# Patient Record
Sex: Male | Born: 1990 | Race: White | Hispanic: No | Marital: Single | Smoking: Never smoker
Health system: Southern US, Community
[De-identification: ages and names within clinical notes are randomized; demographics above are authoritative.]

## PROBLEM LIST (undated history)

## (undated) DIAGNOSIS — J329 Chronic sinusitis, unspecified: Secondary | ICD-10-CM

## (undated) DIAGNOSIS — T7840XA Allergy, unspecified, initial encounter: Secondary | ICD-10-CM

## (undated) HISTORY — PX: ADENOIDECTOMY: SUR15

## (undated) HISTORY — DX: Chronic sinusitis, unspecified: J32.9

## (undated) HISTORY — PX: OTHER SURGICAL HISTORY: SHX169

## (undated) HISTORY — PX: TYMPANOTOMY: SHX2588

## (undated) HISTORY — DX: Allergy, unspecified, initial encounter: T78.40XA

---
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
## 2008-01-12 ENCOUNTER — Ambulatory Visit: Payer: Self-pay | Admitting: Family Medicine

## 2008-01-12 DIAGNOSIS — J309 Allergic rhinitis, unspecified: Secondary | ICD-10-CM | POA: Insufficient documentation

## 2008-01-25 ENCOUNTER — Encounter: Payer: Self-pay | Admitting: Family Medicine

## 2008-04-16 ENCOUNTER — Telehealth: Payer: Self-pay | Admitting: Family Medicine

## 2008-10-18 ENCOUNTER — Ambulatory Visit: Payer: Self-pay | Admitting: Family Medicine

## 2008-10-18 DIAGNOSIS — J019 Acute sinusitis, unspecified: Secondary | ICD-10-CM

## 2008-12-15 ENCOUNTER — Ambulatory Visit: Payer: Self-pay | Admitting: Family Medicine

## 2008-12-15 DIAGNOSIS — H6690 Otitis media, unspecified, unspecified ear: Secondary | ICD-10-CM | POA: Insufficient documentation

## 2009-08-12 ENCOUNTER — Telehealth (INDEPENDENT_AMBULATORY_CARE_PROVIDER_SITE_OTHER): Payer: Self-pay | Admitting: *Deleted

## 2009-09-02 ENCOUNTER — Telehealth: Payer: Self-pay | Admitting: Family Medicine

## 2009-09-02 DIAGNOSIS — L708 Other acne: Secondary | ICD-10-CM | POA: Insufficient documentation

## 2010-02-25 NOTE — Progress Notes (Signed)
    Tetanus/Td Immunization History:    Tetanus/Td # 1:  Historical (07/22/2007)  Meningococcal Immunization History:    Meningococcal # 1:  Menactra (07/22/2007)

## 2010-02-25 NOTE — Progress Notes (Signed)
Summary: Derm referral  Phone Note Call from Patient Call back at Home Phone [phone]   Caller: Mom Call For: Nani Gasser MD Summary of Call: mom calls and wants to know if they need a referral to see derm for his acne Initial call taken by: Kathlene November,  September 02, 2009 12:01 PM  Follow-up for Phone Call        Will refer. tThat is fine.  Follow-up by: Nani Gasser MD,  September 02, 2009 12:03 PM  Additional Follow-up for Phone Call Additional follow up Details #1::        informed pt mom usually did not ned a referral. 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

## 2010-04-06 ENCOUNTER — Emergency Department (HOSPITAL_BASED_OUTPATIENT_CLINIC_OR_DEPARTMENT_OTHER)
Admission: EM | Admit: 2010-04-06 | Discharge: 2010-04-06 | Disposition: A | Discharge status: Home / Self Care | Payer: BC Managed Care – PPO | Attending: Emergency Medicine | Admitting: Emergency Medicine

## 2010-04-06 DIAGNOSIS — Y9366 Activity, soccer: Secondary | ICD-10-CM | POA: Insufficient documentation

## 2010-04-06 DIAGNOSIS — S060X0A Concussion without loss of consciousness, initial encounter: Secondary | ICD-10-CM | POA: Insufficient documentation

## 2010-04-06 DIAGNOSIS — W219XXA Striking against or struck by unspecified sports equipment, initial encounter: Secondary | ICD-10-CM | POA: Insufficient documentation

## 2010-05-22 ENCOUNTER — Ambulatory Visit: Payer: BC Managed Care – PPO | Admitting: Family Medicine

## 2010-05-23 ENCOUNTER — Other Ambulatory Visit: Payer: Self-pay | Admitting: *Deleted

## 2010-05-23 MED ORDER — MOMETASONE FUROATE 50 MCG/ACT NA SUSP: NASAL | Status: DC

## 2010-05-23 NOTE — Telephone Encounter (Signed)
Message left by [facility] stating pharmacy faxed request yesterday for Nasonex and has not received any response from this office. Refill given.  Message left on home number that rx has been sent, but any additional refills will not be given without an office visit.

## 2010-08-22 ENCOUNTER — Encounter: Payer: Self-pay | Admitting: Family Medicine

## 2010-08-22 ENCOUNTER — Inpatient Hospital Stay (INDEPENDENT_AMBULATORY_CARE_PROVIDER_SITE_OTHER)
Admission: RE | Admit: 2010-08-22 | Discharge: 2010-08-22 | Disposition: A | Discharge status: Home / Self Care | Payer: BC Managed Care – PPO | Source: Ambulatory Visit | Attending: Family Medicine | Admitting: Family Medicine

## 2010-08-22 DIAGNOSIS — L255 Unspecified contact dermatitis due to plants, except food: Secondary | ICD-10-CM

## 2010-08-23 ENCOUNTER — Telehealth (INDEPENDENT_AMBULATORY_CARE_PROVIDER_SITE_OTHER): Payer: Self-pay

## 2010-08-30 ENCOUNTER — Encounter: Payer: Self-pay | Admitting: Family Medicine

## 2010-09-01 ENCOUNTER — Ambulatory Visit (INDEPENDENT_AMBULATORY_CARE_PROVIDER_SITE_OTHER): Payer: BC Managed Care – PPO | Admitting: Family Medicine

## 2010-09-01 ENCOUNTER — Encounter: Payer: Self-pay | Admitting: Family Medicine

## 2010-09-01 DIAGNOSIS — J309 Allergic rhinitis, unspecified: Secondary | ICD-10-CM

## 2010-09-01 MED ORDER — MOMETASONE FUROATE 50 MCG/ACT NA SUSP: NASAL | Status: DC

## 2010-09-01 NOTE — Progress Notes (Signed)
  Subjective:    Patient ID: Louis Coffey, male    DOB: Feb 21, 1990, 20 y.o.   MRN: [id number]  HPI  Louis Coffey well with allergies.  Using OTC allegra.  Occ using  His nasonex.  Allergies are well controlled. Getting over a cold right now. Has had a cold for about 6 days. Already starting to fell better. Needs refill on meds.  No other complaints  Elevated BP - Not taking any NSAID or decongestants. Feeling well. No cold meds. No SP or SOB.   History updated.   Review of Systems BP 144/85  Pulse 83  Ht 6' (1.829 m)  Wt 202 lb (91.627 kg)  BMI 27.40 kg/m2    Allergies  Allergen Reactions  . Biaxin   . Clarithromycin     REACTION: Hives    Past Medical History  Diagnosis Date  . Allergy   . Recurrent sinus infections     Past Surgical History  Procedure Date  . Tympanotomy     tubes  . Adenoidectomy    . Rt thumb surgery     History   Social History  . Marital Status: Single    Spouse Name: N/A    Number of Children: N/A  . Years of Education: N/A   Occupational History  . Not on file.   Social History Main Topics  . Smoking status: Never Smoker   . Smokeless tobacco: Not on file  . Alcohol Use: Yes  . Drug Use: No  . Sexually Active: Yes    Birth Control/ Protection: Condom   Other Topics Concern  . Not on file   Social History Narrative  . No narrative on file    Family History  Problem Relation Age of Onset  . Hyperlipidemia Mother   . Hypothyroidism Mother   . Hypertension Father   . Asthma Father   . Sleep apnea Father   . Ovarian cancer Maternal Grandmother   . Diabetes Paternal Grandfather   . Allergies Sister     [name] had no medications administered during this visit.     Objective:   Physical Exam  Constitutional: He is oriented to person, place, and time. He appears well-developed and well-nourished.  HENT:  Head: Normocephalic and atraumatic.  Right Ear: External ear normal.  Left Ear: External ear normal.  Nose: Nose  normal.  Mouth/Throat: Oropharynx is clear and moist.       TMs and canals are clear.   Eyes: Conjunctivae and EOM are normal. Pupils are equal, round, and reactive to light.  Neck: Neck supple. No thyromegaly present.  Cardiovascular: Normal rate and normal heart sounds.   Pulmonary/Chest: Effort normal and breath sounds normal.  Lymphadenopathy:    He has no cervical adenopathy.  Neurological: He is alert and oriented to person, place, and time.  Skin: Skin is warm and dry.  Psychiatric: He has a normal mood and affect.          Assessment & Plan:  Elevated BP - Recheck today was just under goal so recheck in one year.

## 2010-09-01 NOTE — Assessment & Plan Note (Signed)
Doing well.  Refill nasonex. F/u in 1 yr.

## 2010-11-17 ENCOUNTER — Ambulatory Visit: Payer: BC Managed Care – PPO | Admitting: Family Medicine

## 2010-12-07 ENCOUNTER — Encounter (HOSPITAL_COMMUNITY): Payer: Self-pay | Admitting: Adult Health

## 2010-12-07 ENCOUNTER — Emergency Department (HOSPITAL_COMMUNITY)
Admission: EM | Admit: 2010-12-07 | Discharge: 2010-12-07 | Disposition: A | Discharge status: Home / Self Care | Payer: BC Managed Care – PPO | Attending: Emergency Medicine | Admitting: Emergency Medicine

## 2010-12-07 DIAGNOSIS — S01111A Laceration without foreign body of right eyelid and periocular area, initial encounter: Secondary | ICD-10-CM

## 2010-12-07 DIAGNOSIS — W219XXA Striking against or struck by unspecified sports equipment, initial encounter: Secondary | ICD-10-CM | POA: Insufficient documentation

## 2010-12-07 DIAGNOSIS — Y9366 Activity, soccer: Secondary | ICD-10-CM | POA: Insufficient documentation

## 2010-12-07 DIAGNOSIS — Y9239 Other specified sports and athletic area as the place of occurrence of the external cause: Secondary | ICD-10-CM | POA: Insufficient documentation

## 2010-12-07 DIAGNOSIS — S0180XA Unspecified open wound of other part of head, initial encounter: Secondary | ICD-10-CM | POA: Insufficient documentation

## 2010-12-07 NOTE — ED Notes (Signed)
Elbow to the eye while playing soccer. Right eye with small laceration

## 2010-12-08 NOTE — ED Provider Notes (Signed)
History     CSN: [id number] Arrival date & time: 12/07/2010  8:36 PM   First MD Initiated Contact with Patient 12/07/10 2043      Chief Complaint  Patient presents with  . Facial Laceration    (Consider location/radiation/quality/duration/timing/severity/associated sxs/prior treatment) Patient is a 20 y.o. male presenting with scalp laceration. The history is provided by the patient.  Head Laceration This is a new problem. The current episode started today. The problem has been unchanged. Pertinent negatives include no nausea, neck pain, vomiting or weakness. The symptoms are aggravated by nothing. He has tried nothing for the symptoms.  Pt states he was playing soccer when he got elbowed in the right EYE. Reports laceration to the right eye brow. Denies LOC. Deneis headache, dizziness, nausea, vomiting, visual changes. No other complaints.   Past Medical History  Diagnosis Date  . Allergy   . Recurrent sinus infections     Past Surgical History  Procedure Date  . Tympanotomy     tubes  . Adenoidectomy    . Rt thumb surgery     Family History  Problem Relation Age of Onset  . Hyperlipidemia Mother   . Hypothyroidism Mother   . Hypertension Father   . Asthma Father   . Sleep apnea Father   . Ovarian cancer Maternal Grandmother   . Diabetes Paternal Grandfather   . Allergies Sister     History  Substance Use Topics  . Smoking status: Never Smoker   . Smokeless tobacco: Not on file  . Alcohol Use: Yes      Review of Systems  HENT: Negative for neck pain.   Gastrointestinal: Negative for nausea and vomiting.  Neurological: Negative for weakness.  All other systems reviewed and are negative.    Allergies  Biaxin and Clarithromycin  Home Medications   Current Outpatient Rx  Name Route Sig Dispense Refill  . FEXOFENADINE HCL 180 MG PO TABS Oral Take 180 mg by mouth daily.      . MOMETASONE FUROATE 50 MCG/ACT NA SUSP  Use 2 sprays in each nostril daily  17 g 11    BP 134/71  Pulse 84  Temp(Src) 98.3 F (36.8 C) (Oral)  Resp 20  SpO2 100%  Physical Exam  Constitutional: He is oriented to person, place, and time. He appears well-developed and well-nourished. No distress.  HENT:  Head: Normocephalic.  Nose: Nose normal.       See skin exam.  Eyes: Conjunctivae and EOM are normal. Pupils are equal, round, and reactive to light.  Neck: Neck supple.  Cardiovascular: Normal rate, regular rhythm and normal heart sounds.   Pulmonary/Chest: Effort normal and breath sounds normal. No respiratory distress.  Musculoskeletal: Normal range of motion.  Neurological: He is alert and oriented to person, place, and time.  Skin: Skin is warm and dry.       2cm superficial laceratio tot he right eyebrow. Hemostatic, linear  Psychiatric: He has a normal mood and affect.    ED Course  Procedures (including critical care time)  No LOC, no headache, no neuro findings. Laceration cleaned with normal saline. Repared with dermabond. Pt in NAD. Tetanus is up to date. Will d/c home.   MDM          Lottie Mussel, PA 12/08/10 0120

## 2010-12-11 NOTE — ED Provider Notes (Signed)
Medical screening examination/treatment/procedure(s) were performed by non-physician practitioner and as supervising physician I was immediately available for consultation/collaboration.   Gwyneth Sprout, MD 12/11/10 1110

## 2010-12-15 ENCOUNTER — Other Ambulatory Visit: Payer: Self-pay | Admitting: Family Medicine

## 2010-12-29 NOTE — Progress Notes (Signed)
Summary: poison ivy Room 5   Vital Signs:  Patient Profile:   20 Years Old Male CC:      Poison Ivy on both legs x 6 days Height:     72 inches Weight:      203 pounds O2 Sat:      99 % O2 treatment:    Room Air Temp:     98.7 degrees F oral Pulse rate:   73 / minute Pulse rhythm:   regular Resp:     16 per minute BP sitting:   131 / 83  (left arm) Cuff size:   regular  Vitals Entered By: Emilio Math (August 22, 2010 9:22 AM)                  Current Allergies (reviewed today): ! BIAXIN ! * WHITE FISHHistory of Present Illness Chief Complaint: Poison Ivy on both legs x 6 days History of Present Illness:  Subjective:  Patient complains of contact with poison ivy, and now has pruritic rash on legs.  Feels well.  Current Meds FEXOFENADINE HCL 180 MG TABS (FEXOFENADINE HCL) Take one tablet by mouth once a day [BMN] NASONEX 50 MCG/ACT SUSP (MOMETASONE FUROATE) Use two sprays in each nostril daily ALLEGRA ALLERGY 180 MG TABS (FEXOFENADINE HCL)  DOXYCYCLINE HYCLATE 100 MG CAPS (DOXYCYCLINE HYCLATE)  DEXPAK 6 DAY 1.5 MG TABS (DEXAMETHASONE) Take as directed  REVIEW OF SYSTEMS Constitutional Symptoms      Denies fever, chills, night sweats, weight loss, weight gain, and fatigue.  Eyes       Denies change in vision, eye pain, eye discharge, glasses, contact lenses, and eye surgery. Ear/Nose/Throat/Mouth       Denies hearing loss/aids, change in hearing, ear pain, ear discharge, dizziness, frequent runny nose, frequent nose bleeds, sinus problems, sore throat, hoarseness, and tooth pain or bleeding.  Respiratory       Denies dry cough, productive cough, wheezing, shortness of breath, asthma, bronchitis, and emphysema/COPD.  Cardiovascular       Denies murmurs, chest pain, and tires easily with exhertion.    Gastrointestinal       Denies stomach pain, nausea/vomiting, diarrhea, constipation, blood in bowel movements, and indigestion. Genitourniary       Denies painful  urination, kidney stones, and loss of urinary control. Neurological       Denies paralysis, seizures, and fainting/blackouts. Musculoskeletal       Denies muscle pain, joint pain, joint stiffness, decreased range of motion, redness, swelling, muscle weakness, and gout.  Skin       Denies bruising, unusual mles/lumps or sores, and hair/skin or nail changes.  Psych       Denies mood changes, temper/anger issues, anxiety/stress, speech problems, depression, and sleep problems.  Past History:  Past Medical History: Reviewed history from 10/18/2008 and no changes required. Has been allergy tested (dust mites)  REcurrent sinus infection.   Past Surgical History: Reviewed history from 01/12/2008 and no changes required. Tympanostomy tubes.  Adenoids removed.  Surgery to pin broken right thumb.   Family History: Reviewed history from 01/12/2008 and no changes required. Father with HTN, Asthma, OSA Mom with high cholesterol, hypothyroid GM with ovarian Ca GF with DM  Social History: Reviewed history from 01/12/2008 and no changes required. 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 Living at home. [place]ty moved from [place].  Never Smoked Alcohol use-no Drug use-no Regular exercise-yes   Objective:  Appearance:  Patient appears healthy, stated age, and in  no acute distress  Skin:  On right lower leg there are several erythematous macules about 5mm dia in popliteal area.  On the left lateral calf is a crop of vesicles and a larger bulla on an erythematous base measuring about 7cm by 3cm.  Vesicles contain clear fluid.  No evidence cellulitis. Assessment New Problems: RHUS DERMATITIS (ICD-692.6)   Plan New Medications/Changes: DEXPAK 6 DAY 1.5 MG TABS (DEXAMETHASONE) Take as directed  #1 x 0, 08/22/2010, Donna Christen MD  New Orders: Est. Patient Level III [phone] Planning Comments:   Patient declines injectable steroid.  Begin 6 day Dexpak.  May apply calamine lotion  as an astrigent for weeping blisters.   Return for any signs of infection, or if not improving.   The patient and/or caregiver has been counseled thoroughly with regard to medications prescribed including dosage, schedule, interactions, rationale for use, and possible side effects and they verbalize understanding.  Diagnoses and expected course of recovery discussed and will return if not improved as expected or if the condition worsens. Patient and/or caregiver verbalized understanding.  Prescriptions: DEXPAK 6 DAY 1.5 MG TABS (DEXAMETHASONE) Take as directed  #1 x 0   Entered and Authorized by:   Donna Christen MD   Signed by:   Donna Christen MD on 08/22/2010   Method used:   Print then Give to Patient   RxID:   [id number]   Orders Added: 1)  Est. Patient Level III [24401]

## 2010-12-29 NOTE — Telephone Encounter (Signed)
  Phone Note Outgoing Call   Call placed by: Linton Flemings RN,  August 23, 2010 12:03 PM Call placed to: Patient Summary of Call: pt was out of town, mother states he was doing ok.

## 2011-02-03 ENCOUNTER — Ambulatory Visit (INDEPENDENT_AMBULATORY_CARE_PROVIDER_SITE_OTHER): Payer: BC Managed Care – PPO | Admitting: Family Medicine

## 2011-02-03 ENCOUNTER — Encounter: Payer: Self-pay | Admitting: Family Medicine

## 2011-02-03 DIAGNOSIS — M79609 Pain in unspecified limb: Secondary | ICD-10-CM

## 2011-02-03 DIAGNOSIS — M79643 Pain in unspecified hand: Secondary | ICD-10-CM

## 2011-02-03 NOTE — Progress Notes (Signed)
  Subjective:    Patient ID: Louis Coffey, male    DOB: 1991/01/15, 21 y.o.   MRN: [id number]  HPI Was golfing and hti the ball and immediately had pain.  Says was about 5 weeks ago. Laid off sports and it started to feel better. Then went back and played basket ball and hurt it again.  Initially whole hand hurt and hut to squeeze. Now pain is mostly on the outside of hand. Tried to rest it. Still occ hurts. Worse with putting weight on it. No weakness. No swelling.      Review of Systems     Objective:   Physical Exam  rIGHT HAND w  NROM. Strength 5/5 in all the fingers.  No swelling. No point tenderness. No redness or tenderness over the wrist.       Assessment & Plan:  Right hand pain - Likley not fracture. likley a tendonitis. Advil 600mg  tid with food and water as needed. Call if not continuing to get better in the next couple weeks.   Elevated BP- Discused low sat diet and recheck in one month.

## 2011-02-03 NOTE — Patient Instructions (Signed)
Call if not better in 2-3 weeks Ice the are 1-2 x a day.  Gentle range of motion stretches Advil 600mg  up to 3 x a day with food and water.

## 2011-02-27 ENCOUNTER — Encounter: Payer: Self-pay | Admitting: Physician Assistant

## 2011-02-27 ENCOUNTER — Ambulatory Visit: Payer: BC Managed Care – PPO | Admitting: Physician Assistant

## 2011-02-27 ENCOUNTER — Ambulatory Visit (INDEPENDENT_AMBULATORY_CARE_PROVIDER_SITE_OTHER): Payer: BC Managed Care – PPO | Admitting: Physician Assistant

## 2011-02-27 VITALS — BP 146/81 | HR 99 | Wt 208.0 lb

## 2011-02-27 DIAGNOSIS — I10 Essential (primary) hypertension: Secondary | ICD-10-CM

## 2011-02-27 MED ORDER — LISINOPRIL 10 MG PO TABS: 10.0000 mg | ORAL_TABLET | Freq: Every day | ORAL | Status: DC

## 2011-02-27 NOTE — Patient Instructions (Signed)
Start on lisinopril 10mg  daily. Can Try Excedrin Migraine if you do have headaches. Call in 2 weeks with blood pressure readings and if headaches are better. F/U in 1-2 months.

## 2011-02-27 NOTE — Progress Notes (Signed)
  Subjective:    Patient ID: Louis Coffey, male    DOB: 01/02/1991, 21 y.o.   MRN: [id number]  HPI  Patient presents to the clinic for new onset of headaches. Patient had the first headachewas Friday night after sex.he has had 3 since Friday night. He denies a history of headaches or migraines in the past. He describes the headache as sharp pain radiating to the left frontal and parietal region of his head. He states that the headache lasts sometimes up to 12 hours after sex. He is light and sound sensitive and very nauseous. He denies any blurred vision.his blood pressure has been increasing over the past couple of visits. He stated that Dr.metheney saw him a month ago I wanted to recheck blood pressure. He has tried ibuprofen to releived the pain. Ibuprofen has worked take the edge off.he has a family history of high blood pressure. He drinks about a cup of caffeine daily.   Review of Systems     Objective:   Physical Exam  Constitutional: He is oriented to person, place, and time. He appears well-developed and well-nourished.  HENT:  Head: Normocephalic and atraumatic.  Cardiovascular: Normal rate, regular rhythm and normal heart sounds.   Pulmonary/Chest: Effort normal and breath sounds normal.  Neurological: He is alert and oriented to person, place, and time.       Cranial Nerves II-XII intact.  Psychiatric: He has a normal mood and affect. His behavior is normal.          Assessment & Plan:  New onset hypertension- Rechecked bp (138/92). Started patient on lisinopril 10 mg daily. Instructed patient to call back in 2 weeks and let me now if the headaches have improved and once current blood pressure readings are. His headaches have resolved he can followup in one to 2 months for blood pressure. His headaches are ongoing we will consider a CT scan of the head to rule out aneurysm.

## 2011-08-20 ENCOUNTER — Telehealth: Payer: Self-pay | Admitting: *Deleted

## 2011-08-20 DIAGNOSIS — Z0184 Encounter for antibody response examination: Secondary | ICD-10-CM

## 2011-08-21 LAB — VARICELLA ZOSTER ANTIBODY, IGG: Varicella IgG: 3.82 {ISR} — ABNORMAL HIGH

## 2011-12-16 ENCOUNTER — Other Ambulatory Visit: Payer: Self-pay | Admitting: Family Medicine

## 2012-04-29 ENCOUNTER — Encounter: Payer: Self-pay | Admitting: Family Medicine

## 2012-04-29 ENCOUNTER — Ambulatory Visit (INDEPENDENT_AMBULATORY_CARE_PROVIDER_SITE_OTHER): Payer: BC Managed Care – PPO

## 2012-04-29 ENCOUNTER — Ambulatory Visit (INDEPENDENT_AMBULATORY_CARE_PROVIDER_SITE_OTHER): Payer: BC Managed Care – PPO | Admitting: Family Medicine

## 2012-04-29 VITALS — BP 140/78 | HR 80 | Ht 71.0 in | Wt 210.0 lb

## 2012-04-29 DIAGNOSIS — R079 Chest pain, unspecified: Secondary | ICD-10-CM

## 2012-04-29 DIAGNOSIS — R0789 Other chest pain: Secondary | ICD-10-CM

## 2012-04-29 NOTE — Progress Notes (Signed)
  Subjective:    Patient ID: Louis Coffey, male    DOB: February 03, 1990, 22 y.o.   MRN: [id number]  HPI Chest pain all day yesterday.  No recent injury or chest impact.  Pain in lower mid-sternum but slightly to the right. Worse when lying on his back and standing. Has had this before and started when he had a cold (nausea and runny nose and achey) 3 weeks ago. Says had CP for only about 3 days and then resolve.  Then started again last week when had stomach virus.  Works out about 3-4 times per weeks. Worse and more sharp when takes a deep breath.  No GERD sxs. No fever or SOB.  No prior heart issues. No dysphagia.  No Abdominal Pain. No HA. No CP today. Tried Advil and tylenol with not relief.  Hasn't been dong a lot of weight lifting recently.    Review of Systems     Objective:   Physical Exam  Constitutional: He is oriented to person, place, and time. He appears well-developed and well-nourished.  HENT:  Head: Normocephalic and atraumatic.  Right Ear: External ear normal.  Left Ear: External ear normal.  Nose: Nose normal.  Mouth/Throat: Oropharynx is clear and moist.  TMs and canals are clear.   Eyes: Conjunctivae and EOM are normal. Pupils are equal, round, and reactive to light.  Neck: Neck supple. No thyromegaly present.  Cardiovascular: Normal rate and normal heart sounds.   Pulmonary/Chest: Effort normal and breath sounds normal.  Abdominal: Soft. Bowel sounds are normal. He exhibits no distension and no mass. There is no tenderness. There is no rebound and no guarding.  Musculoskeletal: He exhibits no edema.  Lymphadenopathy:    He has no cervical adenopathy.  Neurological: He is alert and oriented to person, place, and time.  Skin: Skin is warm and dry.  Psychiatric: He has a normal mood and affect.          Assessment & Plan:  Chest pain - Unclear etiology. Consider costochondritis but he did not get relief with an NSAID and his pain has been very intermittent. Will  get chest x-ray to rule out any infectious cause that he's not had a fever or feel short of breath. He is also not had a cough. We did get an EKG today. Please see interpretation below. Consider pericarditis.Thought no rub on cardiac exam and EKG was normal.  Will check CBC as well.   EKG shows rate of 83 bpm with normal sinus rhythm, normal axis.

## 2012-04-30 LAB — CBC WITH DIFFERENTIAL/PLATELET
Eosinophils Absolute: 0.2 10*3/uL (ref 0.0–0.7)
Eosinophils Relative: 2 % (ref 0–5)
HCT: 43.9 % (ref 39.0–52.0)
Lymphocytes Relative: 39 % (ref 12–46)
Lymphs Abs: 2.8 10*3/uL (ref 0.7–4.0)
MCH: 29.3 pg (ref 26.0–34.0)
MCV: 84.1 fL (ref 78.0–100.0)
Monocytes Absolute: 0.7 10*3/uL (ref 0.1–1.0)
Monocytes Relative: 10 % (ref 3–12)
Platelets: 289 10*3/uL (ref 150–400)
RBC: 5.22 MIL/uL (ref 4.22–5.81)

## 2012-05-02 ENCOUNTER — Encounter: Payer: Self-pay | Admitting: *Deleted

## 2012-08-19 ENCOUNTER — Other Ambulatory Visit: Payer: Self-pay | Admitting: Family Medicine

## 2012-08-19 ENCOUNTER — Other Ambulatory Visit: Payer: Self-pay

## 2012-08-23 ENCOUNTER — Other Ambulatory Visit: Payer: Self-pay | Admitting: *Deleted

## 2012-08-23 MED ORDER — MOMETASONE FUROATE 50 MCG/ACT NA SUSP: NASAL | Status: DC

## 2012-08-30 ENCOUNTER — Encounter: Payer: Self-pay | Admitting: Family Medicine

## 2012-08-30 ENCOUNTER — Ambulatory Visit (INDEPENDENT_AMBULATORY_CARE_PROVIDER_SITE_OTHER): Payer: BC Managed Care – PPO | Admitting: Family Medicine

## 2012-08-30 VITALS — BP 118/90 | HR 89 | Wt 221.0 lb

## 2012-08-30 DIAGNOSIS — R030 Elevated blood-pressure reading, without diagnosis of hypertension: Secondary | ICD-10-CM

## 2012-08-30 DIAGNOSIS — R209 Unspecified disturbances of skin sensation: Secondary | ICD-10-CM

## 2012-08-30 DIAGNOSIS — R202 Paresthesia of skin: Secondary | ICD-10-CM

## 2012-08-30 MED ORDER — MOMETASONE FUROATE 50 MCG/ACT NA SUSP: NASAL | Status: AC

## 2012-08-30 NOTE — Progress Notes (Signed)
  Subjective:    Patient ID: Louis Coffey, male    DOB: [birth year], 24 y.o.   MRN: [id number]  HPI Comes in today complaining of a tingling sensation on his scalp. 22 year old white male with no significant past medical history except for allergies and acne.  started a few months ago. lasts for a few seconds and goes away. pt stated that it feels like ice water drop landing on his head.. episodes have been up to 4-5 x a days.  No vision changes or HA.  Last time happened was about a week ago. No lesions or bumps. No radiation of pain. Always in the same location.  Happens more at work and not on weekends when more relaxed.   He is moving to [place] for a year. He would like a copy of his medications so that in case he needs to get a medication while he is there he wanted to generic name.    Review of Systems No CP or SOB.       Objective:   Physical Exam  Constitutional: He is oriented to person, place, and time. He appears well-developed and well-nourished.  HENT:  Head: Normocephalic and atraumatic.  Right Ear: External ear normal.  Left Ear: External ear normal.  Nose: Nose normal.  Mouth/Throat: Oropharynx is clear and moist.  TMs and canals are clear.   Eyes: Conjunctivae and EOM are normal. Pupils are equal, round, and reactive to light.  Neck: Neck supple. No thyromegaly present.  Cardiovascular: Normal rate and normal heart sounds.   Pulmonary/Chest: Effort normal and breath sounds normal.  Lymphadenopathy:    He has no cervical adenopathy.  Neurological: He is alert and oriented to person, place, and time.  Skin: Skin is warm and dry.  No scalp abnormalities. No rash or palpable lesions.    Psychiatric: He has a normal mood and affect.          Assessment & Plan:  Paresthesias - Gave reassurance that it is benign. No red flags sxs.  No vision, hearing changes. No HA etc. Explained possible etiology.  Call if sxs change, bc painful or spread location.    Elevated  BP- Home BPs are normal. Repeat here was normal. Work on low salt diet.    AR - refilled nasonex x 1 year.

## 2013-09-04 ENCOUNTER — Ambulatory Visit (INDEPENDENT_AMBULATORY_CARE_PROVIDER_SITE_OTHER): Payer: 59 | Admitting: Family Medicine

## 2013-09-04 ENCOUNTER — Encounter: Payer: Self-pay | Admitting: Family Medicine

## 2013-09-04 VITALS — BP 128/86 | HR 89 | Ht 71.0 in | Wt 207.0 lb

## 2013-09-04 DIAGNOSIS — Z23 Encounter for immunization: Secondary | ICD-10-CM

## 2013-09-04 DIAGNOSIS — Z0000 Encounter for general adult medical examination without abnormal findings: Secondary | ICD-10-CM

## 2013-09-04 DIAGNOSIS — R030 Elevated blood-pressure reading, without diagnosis of hypertension: Secondary | ICD-10-CM

## 2013-09-04 DIAGNOSIS — IMO0001 Reserved for inherently not codable concepts without codable children: Secondary | ICD-10-CM

## 2013-09-04 MED ORDER — LISINOPRIL 10 MG PO TABS: 10.0000 mg | ORAL_TABLET | Freq: Every day | ORAL | Status: DC

## 2013-09-04 NOTE — Addendum Note (Signed)
Addended by: Deno EtienneBARKLEY, Deana Krock L on: 09/04/2013 05:34 PM   Modules accepted: Orders

## 2013-09-04 NOTE — Patient Instructions (Signed)
Keep up a regular exercise program and make sure you are eating a healthy diet Try to eat 4 servings of dairy a day, or if you are lactose intolerant take a calcium with vitamin D daily.  Your vaccines are up to date.   

## 2013-09-04 NOTE — Progress Notes (Signed)
   Subjective:    Patient ID: Louis Coffey, male    DOB: [birth year], 23 y.o.   MRN: [id number]  HPI Here for CPE today.  He has been more physically active.  Mostly drinks water.  He has been eating pretty healthy.  No snoring. Dad with fam hx of HTN.    Review of Systems     Objective:   Physical Exam  Constitutional: He is oriented to person, place, and time. He appears well-developed and well-nourished.  HENT:  Head: Normocephalic and atraumatic.  Right Ear: External ear normal.  Left Ear: External ear normal.  Nose: Nose normal.  Mouth/Throat: Oropharynx is clear and moist.  Eyes: Conjunctivae and EOM are normal. Pupils are equal, round, and reactive to light.  Neck: Normal range of motion. Neck supple. No thyromegaly present.  Cardiovascular: Normal rate, regular rhythm, normal heart sounds and intact distal pulses.   Pulmonary/Chest: Effort normal and breath sounds normal.  Abdominal: Soft. Bowel sounds are normal. He exhibits no distension and no mass. There is no tenderness. There is no rebound and no guarding.  Musculoskeletal: Normal range of motion.  Lymphadenopathy:    He has no cervical adenopathy.  Neurological: He is alert and oriented to person, place, and time. He has normal reflexes.  Skin: Skin is warm and dry.  Psychiatric: He has a normal mood and affect. His behavior is normal. Judgment and thought content normal.          Assessment & Plan:  CPE Keep up a regular exercise program and make sure you are eating a healthy diet Try to eat 4 servings of dairy a day, or if you are lactose intolerant take a calcium with vitamin D daily.  Your vaccines are up to date.   HTN - new dx.  Discussed DASH diet.  Also discussed medication.

## 2013-09-06 LAB — COMPLETE METABOLIC PANEL WITH GFR
ALBUMIN: 4.7 g/dL (ref 3.5–5.2)
ALK PHOS: 70 U/L (ref 39–117)
ALT: 23 U/L (ref 0–53)
AST: 17 U/L (ref 0–37)
BUN: 11 mg/dL (ref 6–23)
CO2: 27 mEq/L (ref 19–32)
Calcium: 9.8 mg/dL (ref 8.4–10.5)
Chloride: 104 mEq/L (ref 96–112)
Creat: 0.82 mg/dL (ref 0.50–1.35)
GFR, Est African American: >89 mL/min
GFR, Est Non African American: >89 mL/min
Glucose, Bld: 81 mg/dL (ref 70–99)
POTASSIUM: 4.4 meq/L (ref 3.5–5.3)
SODIUM: 139 meq/L (ref 135–145)
TOTAL PROTEIN: 7.2 g/dL (ref 6.0–8.3)
Total Bilirubin: 0.6 mg/dL (ref 0.2–1.2)

## 2013-09-06 LAB — CBC WITH DIFFERENTIAL/PLATELET
BASOS ABS: 0.1 10*3/uL (ref 0.0–0.1)
BASOS PCT: 1 % (ref 0–1)
EOS PCT: 5 % (ref 0–5)
Eosinophils Absolute: 0.3 10*3/uL (ref 0.0–0.7)
HEMATOCRIT: 45 % (ref 39.0–52.0)
Hemoglobin: 15.6 g/dL (ref 13.0–17.0)
Lymphocytes Relative: 35 % (ref 12–46)
Lymphs Abs: 2.3 10*3/uL (ref 0.7–4.0)
MCH: 29.8 pg (ref 26.0–34.0)
MCHC: 34.7 g/dL (ref 30.0–36.0)
MCV: 85.9 fL (ref 78.0–100.0)
MONO ABS: 0.6 10*3/uL (ref 0.1–1.0)
Monocytes Relative: 9 % (ref 3–12)
NEUTROS ABS: 3.3 10*3/uL (ref 1.7–7.7)
Neutrophils Relative %: 50 % (ref 43–77)
Platelets: 295 10*3/uL (ref 150–400)
RBC: 5.24 MIL/uL (ref 4.22–5.81)
RDW: 14 % (ref 11.5–15.5)
WBC: 6.5 10*3/uL (ref 4.0–10.5)

## 2013-09-06 LAB — TSH: TSH: 1.323 u[IU]/mL (ref 0.350–4.500)

## 2013-09-06 LAB — LIPID PANEL
CHOL/HDL RATIO: 4 ratio
Cholesterol: 169 mg/dL (ref 0–200)
HDL: 42 mg/dL (ref >39)
LDL Cholesterol: 98 mg/dL (ref 0–99)
Triglycerides: 146 mg/dL (ref <150)
VLDL: 29 mg/dL (ref 0–40)

## 2013-09-07 LAB — URINALYSIS
BILIRUBIN URINE: NEGATIVE
Glucose, UA: NEGATIVE mg/dL
Hgb urine dipstick: NEGATIVE
Ketones, ur: NEGATIVE mg/dL
Leukocytes, UA: NEGATIVE
Nitrite: NEGATIVE
PROTEIN: NEGATIVE mg/dL
Specific Gravity, Urine: 1.01 (ref 1.005–1.030)
UROBILINOGEN UA: 0.2 mg/dL (ref 0.0–1.0)
pH: 7 (ref 5.0–8.0)

## 2013-10-03 ENCOUNTER — Other Ambulatory Visit: Payer: Self-pay | Admitting: Family Medicine

## 2013-10-16 ENCOUNTER — Ambulatory Visit (INDEPENDENT_AMBULATORY_CARE_PROVIDER_SITE_OTHER): Payer: 59 | Admitting: Family Medicine

## 2013-10-16 ENCOUNTER — Encounter: Payer: Self-pay | Admitting: Family Medicine

## 2013-10-16 VITALS — BP 122/69 | HR 75 | Temp 97.6°F | Ht 71.0 in | Wt 200.0 lb

## 2013-10-16 DIAGNOSIS — I10 Essential (primary) hypertension: Secondary | ICD-10-CM

## 2013-10-16 DIAGNOSIS — Z23 Encounter for immunization: Secondary | ICD-10-CM

## 2013-10-16 NOTE — Progress Notes (Signed)
   Subjective:    Patient ID: Louis Coffey, male    DOB: 06/03/1990, 23 y.o.   MRN: [id number]  Hypertension   Hypertension- Pt denies chest pain, SOB, dizziness, or heart palpitations.  Taking meds as directed w/o problems.  Denies medication side effects.   Review of Systems     Objective:   Physical Exam  Constitutional: He is oriented to person, place, and time. He appears well-developed and well-nourished.  HENT:  Head: Normocephalic and atraumatic.  Cardiovascular: Normal rate, regular rhythm and normal heart sounds.   Pulmonary/Chest: Effort normal and breath sounds normal.  Neurological: He is alert and oriented to person, place, and time.  Skin: Skin is warm and dry.  Psychiatric: He has a normal mood and affect. His behavior is normal.          Assessment & Plan:  HTN - well controlled.  Continue current regimen. Followup in 6 months. Due for BMP today just to make sure potassium and kidney function are stable on the ACE inhibitor.  Second Gardasil vaccine given today.

## 2013-10-16 NOTE — Addendum Note (Signed)
Addended by: Deno Etienne on: 10/16/2013 12:55 PM   Modules accepted: Orders

## 2013-10-17 LAB — BASIC METABOLIC PANEL WITH GFR
BUN: 13 mg/dL (ref 6–23)
CALCIUM: 10 mg/dL (ref 8.4–10.5)
CO2: 27 mEq/L (ref 19–32)
Chloride: 103 mEq/L (ref 96–112)
Creat: 0.89 mg/dL (ref 0.50–1.35)
Glucose, Bld: 80 mg/dL (ref 70–99)
Potassium: 4.9 mEq/L (ref 3.5–5.3)
SODIUM: 137 meq/L (ref 135–145)

## 2013-10-17 NOTE — Progress Notes (Signed)
Quick Note:  All labs are normal. ______ 

## 2013-10-29 IMAGING — CR DG CHEST 2V
2 series · 2 of 2 positions shown · non-contrast
Comparison: None.

CLINICAL DATA: Right-sided chest pain at lower sternal border.
Symptoms off and on.

CHEST - 2 VIEW

[view not recorded (1 of 2)]
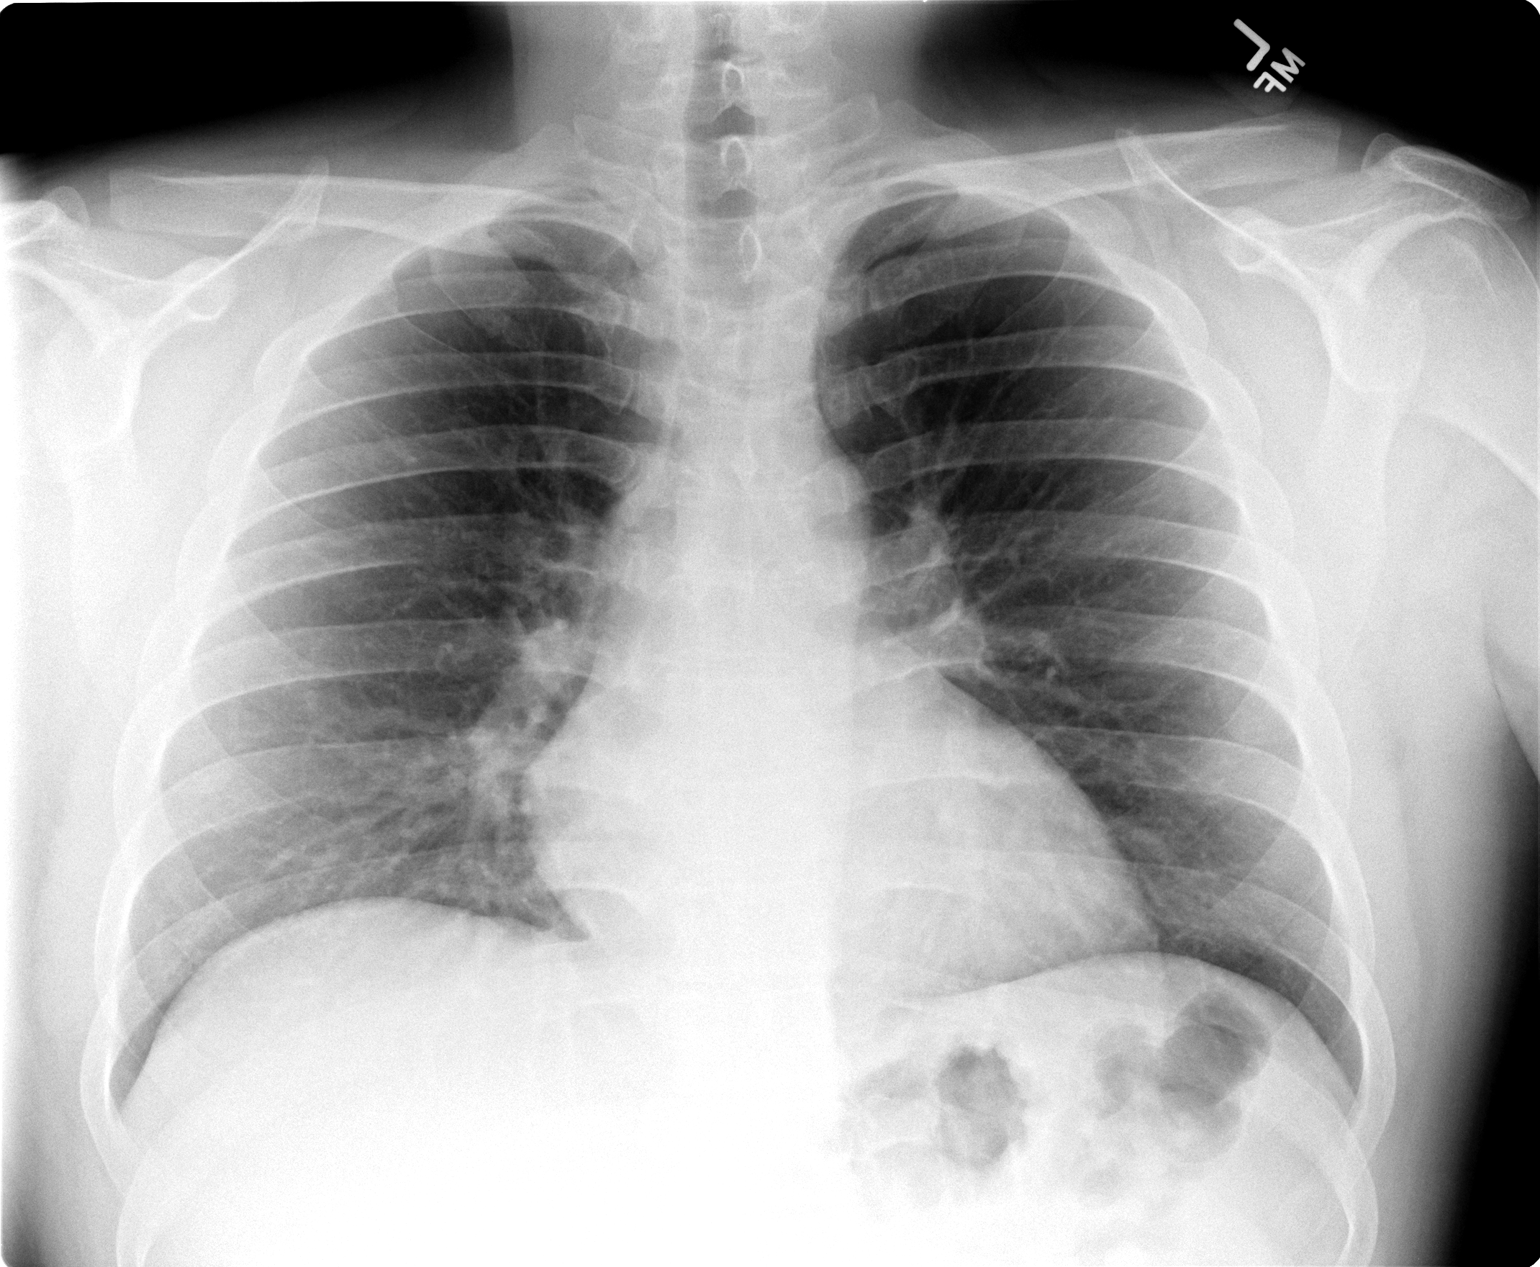

[view not recorded (2 of 2)]
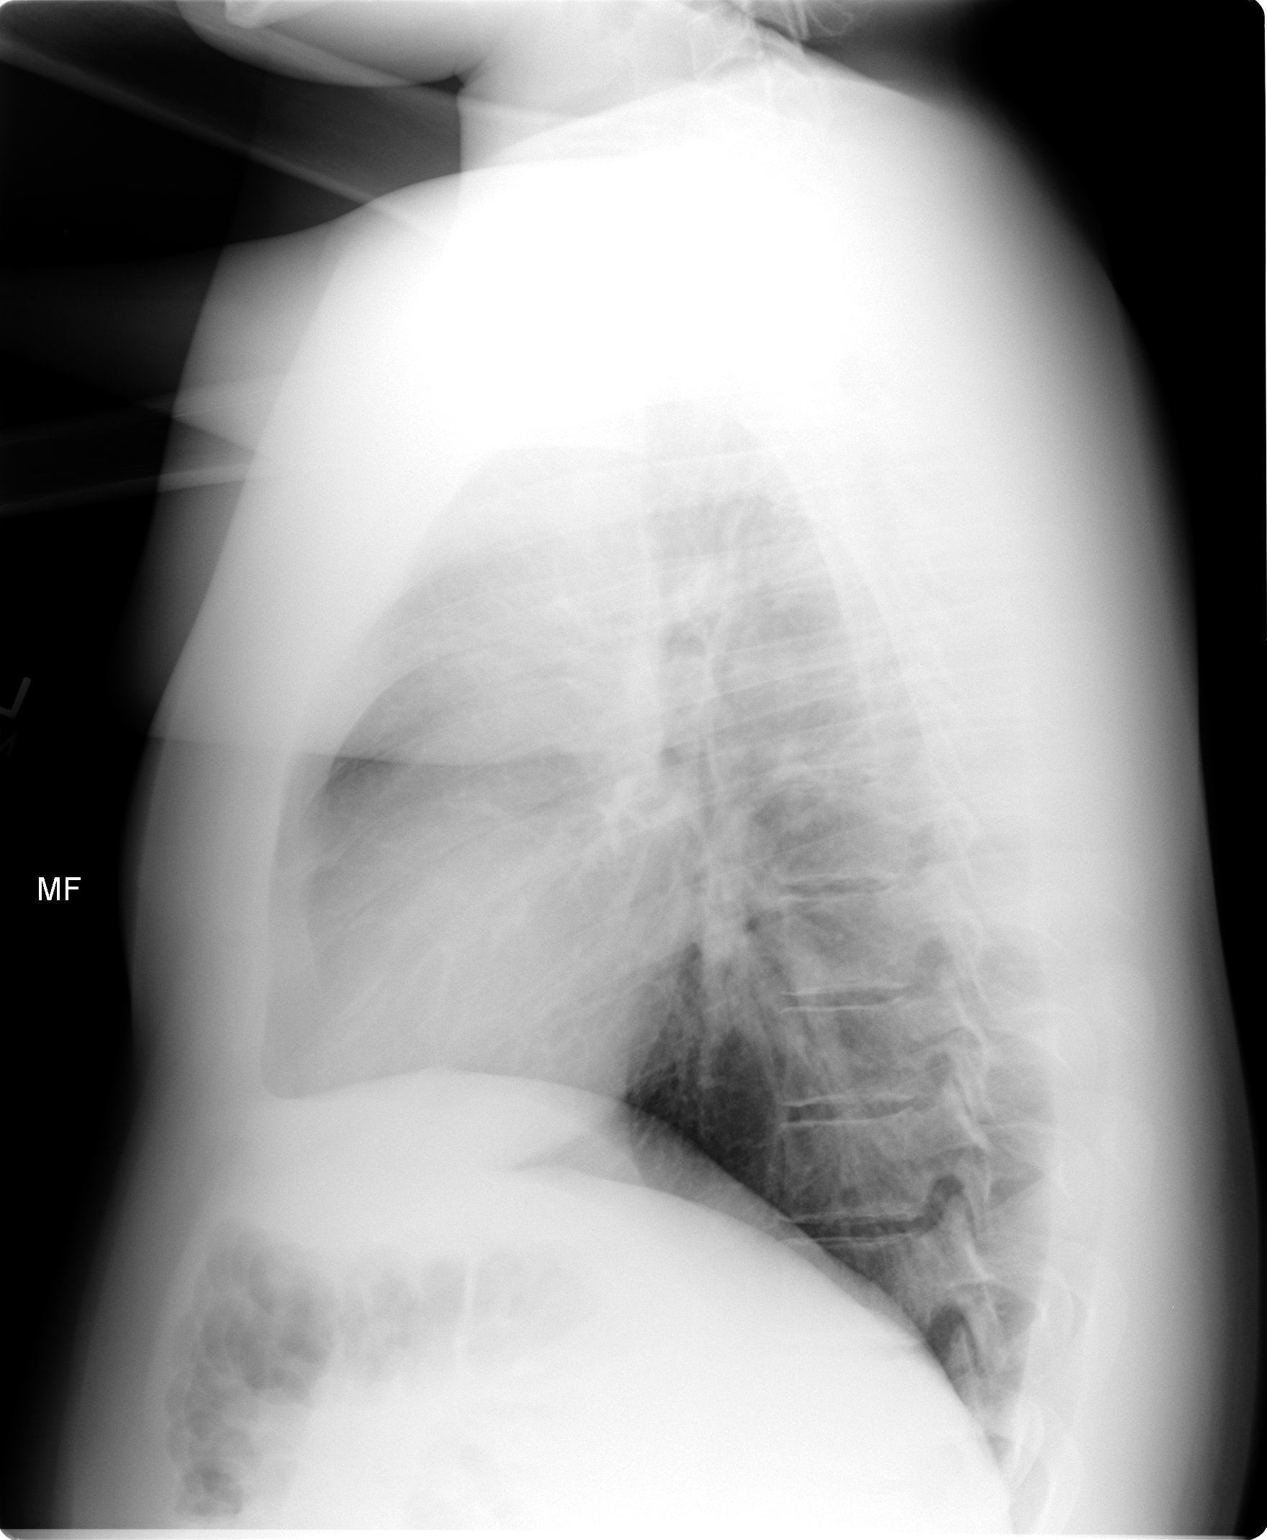

[2 of 2 positions shown; findings below may reference images not displayed]

FINDINGS: Heart size is upper limits normal.  The lungs are free of
focal consolidations and pleural effusions.  No pulmonary edema.
Visualized osseous structures have a normal appearance.
IMPRESSION: Negative exam.

## 2013-12-14 ENCOUNTER — Other Ambulatory Visit: Payer: Self-pay | Admitting: Family Medicine

## 2014-01-10 ENCOUNTER — Telehealth: Payer: Self-pay

## 2014-01-10 NOTE — Telephone Encounter (Signed)
Left detailed message.   

## 2014-01-10 NOTE — Telephone Encounter (Signed)
I agree, she is great!

## 2014-01-10 NOTE — Telephone Encounter (Signed)
Tadashi's mom, Bonita QuinLinda, called and asked if Dr Linford ArnoldMetheney could recommend a good primary care doctor in or around [place].    I worked for Dr Raquel Jamesurnbull [place]. She is a Armed forces operational officerwonderful provider. It may be too far for him.  Ursula Beathurnbull Jennifer R MD Address: [address], [place], [place] [address] [phone]

## 2014-01-30 ENCOUNTER — Telehealth: Payer: Self-pay | Admitting: *Deleted

## 2014-01-30 NOTE — Telephone Encounter (Signed)
Nasonex approved [id number]. Valid till 01/31/15. Pharmacy notified.

## 2014-01-30 NOTE — Telephone Encounter (Signed)
Prior auth for nasonex initiated via cover my meds today.

## 2014-03-03 ENCOUNTER — Other Ambulatory Visit: Payer: Self-pay | Admitting: Family Medicine

## 2014-10-01 ENCOUNTER — Other Ambulatory Visit: Payer: Self-pay | Admitting: Family Medicine

## 2014-10-03 ENCOUNTER — Ambulatory Visit (INDEPENDENT_AMBULATORY_CARE_PROVIDER_SITE_OTHER): Payer: 59 | Admitting: Family Medicine

## 2014-10-03 ENCOUNTER — Encounter: Payer: Self-pay | Admitting: Family Medicine

## 2014-10-03 VITALS — BP 109/70 | HR 85 | Temp 98.2°F | Ht 71.0 in | Wt 202.0 lb

## 2014-10-03 DIAGNOSIS — M545 Low back pain: Secondary | ICD-10-CM

## 2014-10-03 DIAGNOSIS — Z23 Encounter for immunization: Secondary | ICD-10-CM

## 2014-10-03 DIAGNOSIS — I10 Essential (primary) hypertension: Secondary | ICD-10-CM

## 2014-10-03 DIAGNOSIS — M7918 Myalgia, other site: Secondary | ICD-10-CM

## 2014-10-03 MED ORDER — LISINOPRIL 10 MG PO TABS: 10.0000 mg | ORAL_TABLET | Freq: Every day | ORAL | Status: AC

## 2014-10-03 NOTE — Progress Notes (Signed)
   Subjective:    Patient ID: Louis Coffey, male    DOB: Nov 30, 1990, 24 y.o.   MRN: [id number]  HPI Nodule on low back that has been there for about 3 months. Says size has gone up and down..  Says changed his chair at work and says it has helped and is getting smaller. No injury.   Leaving again on Monday. 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    Hypertension- Pt denies chest pain, SOB, dizziness, or heart palpitations.  Taking meds as directed w/o problems.  Denies medication side effects.    Get 3rd HPV again.    Review of Systems     Objective:   Physical Exam  Constitutional: He is oriented to person, place, and time. He appears well-developed and well-nourished.  HENT:  Head: Normocephalic and atraumatic.  Cardiovascular: Normal rate, regular rhythm and normal heart sounds.   Pulmonary/Chest: Effort normal and breath sounds normal.  Musculoskeletal:  Mildly swollen and tense muscle just above the SI joint. Larger than the right side.   Neurological: He is alert and oriented to person, place, and time.  Skin: Skin is warm and dry.  Psychiatric: He has a normal mood and affect. His behavior is normal.          Assessment & Plan:  Muscle injury- gave reassurnce. Healing well.  F/U prn   HTN - well controlled. WEight looks great.  F/U PRN.  Given copy of med list to take with him for trip. Refilled med.   Given 3rd HPV.

## 2014-10-03 NOTE — Addendum Note (Signed)
Addended by: Deno Etienne on: 10/03/2014 01:51 PM   Modules accepted: Orders

## 2014-12-24 ENCOUNTER — Other Ambulatory Visit: Payer: Self-pay | Admitting: Family Medicine
